# Patient Record
Sex: Female | Born: 1955 | Race: Black or African American | Hispanic: No | Marital: Married | Smoking: Never smoker
Health system: Southern US, Community
[De-identification: ages and names within clinical notes are randomized; demographics above are authoritative.]

---

## 2007-07-14 ENCOUNTER — Ambulatory Visit: Payer: Self-pay | Admitting: Gastroenterology

## 2007-07-31 ENCOUNTER — Ambulatory Visit: Payer: Self-pay | Admitting: Family Medicine

## 2007-08-06 ENCOUNTER — Ambulatory Visit: Payer: Self-pay | Admitting: Family Medicine

## 2007-08-07 ENCOUNTER — Ambulatory Visit: Payer: Self-pay | Admitting: Physician Assistant

## 2008-09-28 ENCOUNTER — Ambulatory Visit: Payer: Self-pay | Admitting: Obstetrics and Gynecology

## 2009-07-24 ENCOUNTER — Emergency Department: Payer: Self-pay | Admitting: Internal Medicine

## 2009-12-14 ENCOUNTER — Ambulatory Visit: Payer: Self-pay | Admitting: Obstetrics and Gynecology

## 2011-10-09 ENCOUNTER — Ambulatory Visit: Payer: Self-pay | Admitting: Obstetrics and Gynecology

## 2013-03-17 ENCOUNTER — Ambulatory Visit: Payer: Self-pay | Admitting: Family Medicine

## 2013-09-29 DIAGNOSIS — E119 Type 2 diabetes mellitus without complications: Secondary | ICD-10-CM | POA: Insufficient documentation

## 2013-09-29 DIAGNOSIS — E785 Hyperlipidemia, unspecified: Secondary | ICD-10-CM | POA: Insufficient documentation

## 2013-09-29 DIAGNOSIS — E559 Vitamin D deficiency, unspecified: Secondary | ICD-10-CM | POA: Insufficient documentation

## 2014-05-13 ENCOUNTER — Ambulatory Visit: Payer: Self-pay | Admitting: Family Medicine

## 2015-10-25 ENCOUNTER — Other Ambulatory Visit: Payer: Self-pay | Admitting: Family Medicine

## 2015-10-25 DIAGNOSIS — Z1231 Encounter for screening mammogram for malignant neoplasm of breast: Secondary | ICD-10-CM

## 2015-11-10 ENCOUNTER — Other Ambulatory Visit: Payer: Self-pay | Admitting: Family Medicine

## 2015-11-10 ENCOUNTER — Ambulatory Visit
Admission: RE | Admit: 2015-11-10 | Discharge: 2015-11-10 | Disposition: A | Discharge status: Home / Self Care | Payer: 59 | Source: Ambulatory Visit | Attending: Family Medicine | Admitting: Family Medicine

## 2015-11-10 DIAGNOSIS — Z1231 Encounter for screening mammogram for malignant neoplasm of breast: Secondary | ICD-10-CM

## 2016-10-02 ENCOUNTER — Other Ambulatory Visit: Payer: Self-pay | Admitting: Family Medicine

## 2016-10-02 DIAGNOSIS — Z1231 Encounter for screening mammogram for malignant neoplasm of breast: Secondary | ICD-10-CM

## 2016-11-13 ENCOUNTER — Ambulatory Visit
Admission: RE | Admit: 2016-11-13 | Discharge: 2016-11-13 | Disposition: A | Discharge status: Home / Self Care | Payer: 59 | Source: Ambulatory Visit | Attending: Family Medicine | Admitting: Family Medicine

## 2016-11-13 DIAGNOSIS — Z1231 Encounter for screening mammogram for malignant neoplasm of breast: Secondary | ICD-10-CM | POA: Insufficient documentation

## 2017-03-26 HISTORY — PX: BREAST CYST ASPIRATION: SHX578

## 2017-05-14 ENCOUNTER — Other Ambulatory Visit: Payer: Self-pay | Admitting: Obstetrics and Gynecology

## 2017-05-14 DIAGNOSIS — Z1231 Encounter for screening mammogram for malignant neoplasm of breast: Secondary | ICD-10-CM

## 2017-11-14 ENCOUNTER — Ambulatory Visit
Admission: RE | Admit: 2017-11-14 | Discharge: 2017-11-14 | Disposition: A | Discharge status: Home / Self Care | Payer: BLUE CROSS/BLUE SHIELD | Source: Ambulatory Visit | Attending: Obstetrics and Gynecology | Admitting: Obstetrics and Gynecology

## 2017-11-14 DIAGNOSIS — Z1231 Encounter for screening mammogram for malignant neoplasm of breast: Secondary | ICD-10-CM | POA: Diagnosis not present

## 2017-11-15 ENCOUNTER — Other Ambulatory Visit: Payer: Self-pay | Admitting: Obstetrics and Gynecology

## 2017-11-15 DIAGNOSIS — R928 Other abnormal and inconclusive findings on diagnostic imaging of breast: Secondary | ICD-10-CM

## 2017-11-28 ENCOUNTER — Ambulatory Visit
Admission: RE | Admit: 2017-11-28 | Discharge: 2017-11-28 | Disposition: A | Discharge status: Home / Self Care | Payer: BLUE CROSS/BLUE SHIELD | Source: Ambulatory Visit | Attending: Obstetrics and Gynecology | Admitting: Obstetrics and Gynecology

## 2017-11-28 DIAGNOSIS — R928 Other abnormal and inconclusive findings on diagnostic imaging of breast: Secondary | ICD-10-CM | POA: Diagnosis not present

## 2017-12-02 ENCOUNTER — Other Ambulatory Visit: Payer: Self-pay | Admitting: Obstetrics and Gynecology

## 2017-12-02 DIAGNOSIS — R928 Other abnormal and inconclusive findings on diagnostic imaging of breast: Secondary | ICD-10-CM

## 2017-12-02 DIAGNOSIS — N6310 Unspecified lump in the right breast, unspecified quadrant: Secondary | ICD-10-CM

## 2017-12-12 ENCOUNTER — Ambulatory Visit
Admission: RE | Admit: 2017-12-12 | Discharge: 2017-12-12 | Disposition: A | Discharge status: Home / Self Care | Payer: BLUE CROSS/BLUE SHIELD | Source: Ambulatory Visit | Attending: Obstetrics and Gynecology | Admitting: Obstetrics and Gynecology

## 2017-12-12 ENCOUNTER — Other Ambulatory Visit: Payer: Self-pay | Admitting: Obstetrics and Gynecology

## 2017-12-12 DIAGNOSIS — N6310 Unspecified lump in the right breast, unspecified quadrant: Secondary | ICD-10-CM | POA: Diagnosis present

## 2017-12-12 DIAGNOSIS — R928 Other abnormal and inconclusive findings on diagnostic imaging of breast: Secondary | ICD-10-CM

## 2018-12-09 ENCOUNTER — Encounter: Payer: Self-pay | Admitting: Podiatry

## 2018-12-09 ENCOUNTER — Ambulatory Visit: Payer: Managed Care, Other (non HMO) | Admitting: Podiatry

## 2018-12-09 ENCOUNTER — Other Ambulatory Visit: Payer: Self-pay

## 2018-12-09 VITALS — BP 126/78 | HR 79

## 2018-12-09 DIAGNOSIS — L600 Ingrowing nail: Secondary | ICD-10-CM | POA: Diagnosis not present

## 2018-12-09 MED ORDER — GENTAMICIN SULFATE 0.1 % EX CREA: 1.0000 "application " | TOPICAL_CREAM | Freq: Two times a day (BID) | CUTANEOUS | 1 refills | Status: AC

## 2018-12-09 NOTE — Patient Instructions (Signed)

## 2018-12-11 ENCOUNTER — Telehealth: Payer: Self-pay | Admitting: *Deleted

## 2018-12-11 NOTE — Telephone Encounter (Signed)
Pt called again stating she is having sharp pain in her toes and would like to know if the doctor can prescribe her something to help with her pain. Please give patient a call.

## 2018-12-11 NOTE — Telephone Encounter (Signed)
Pt states 12/09/2018 Dr. Amalia Hailey performed ingrown toenail procedures and she is having a lot of burning and sharp pain, she was out of work yesterday, but is at work today, the area is red and puffy.

## 2018-12-11 NOTE — Telephone Encounter (Signed)
I spoke with patient, she stated that her toe was burning and very painful during the night time and Tylenol is not helping.  I informed her that she is more than likely having a reaction to the Phenol and to continue to soak, apply antibiotic ointment as directed and take Ibuprofen 400mg  with one Tylenol ES to see if it gives relief.  I informed her of s/s of infection and if no better by Monday to call office back to be seen.  She verbalized understanding

## 2018-12-12 NOTE — Progress Notes (Signed)
   Subjective: Patient presents today for evaluation of pain to the medial borders of the bilateral great toes that began a few weeks ago. She reports associated redness. Patient is concerned for possible ingrown nail. Wearing shoes and applying pressure to the toes increases the pain. She has not had any treatment for the symptoms. Patient presents today for further treatment and evaluation.  History reviewed. No pertinent past medical history.  Objective:  General: Well developed, nourished, in no acute distress, alert and oriented x3   Dermatology: Skin is warm, dry and supple bilateral. Medial borders of the bilateral great toes appears to be erythematous with evidence of an ingrowing nail. Pain on palpation noted to the border of the nail fold. The remaining nails appear unremarkable at this time. There are no open sores, lesions.  Vascular: Dorsalis Pedis artery and Posterior Tibial artery pedal pulses palpable. No lower extremity edema noted.   Neruologic: Grossly intact via light touch bilateral.  Musculoskeletal: Muscular strength within normal limits in all groups bilateral. Normal range of motion noted to all pedal and ankle joints.   Assesement: #1 Paronychia with ingrowing nail medial borders bilateral great toes #2 Pain in toe #3 Incurvated nail  Plan of Care:  1. Patient evaluated.  2. Discussed treatment alternatives and plan of care. Explained nail avulsion procedure and post procedure course to patient. 3. Patient opted for permanent partial nail avulsion of the medial borders bilateral great toes.  4. Prior to procedure, local anesthesia infiltration utilized using 3 ml of a 50:50 mixture of 2% plain lidocaine and 0.5% plain marcaine in a normal hallux block fashion and a betadine prep performed.  5. Partial permanent nail avulsion with chemical matrixectomy performed using [id number] applications of phenol followed by alcohol flush.  6. Light dressing applied. 7.  Prescription for Gentamicin cream provided to patient to use daily with a bandage.  8. Post op shoes dispensed bilaterally.  9. Return to clinic in 2 weeks.  Works at [employer]. Daughter is getting married in 75 weeks.   Edrick Kins, DPM [facility]  Dr. Edrick Kins, [address]                                        [place], [place] [address]                Office [phone]  Fax [phone]

## 2018-12-26 ENCOUNTER — Ambulatory Visit: Payer: Managed Care, Other (non HMO) | Admitting: Podiatry

## 2018-12-26 ENCOUNTER — Other Ambulatory Visit: Payer: Self-pay

## 2018-12-26 ENCOUNTER — Encounter: Payer: Self-pay | Admitting: Podiatry

## 2018-12-26 DIAGNOSIS — L600 Ingrowing nail: Secondary | ICD-10-CM

## 2018-12-30 NOTE — Progress Notes (Signed)
   Subjective: 63 y.o. female presents today status post permanent nail avulsion procedure of the medial borders of the bilateral great toes that was performed on 12/09/2018. She states the toes have improved but she still experiences some intermittent soreness and drainage from the right one. She has been using the Gentamicin cream and the post op shoes as directed. Patient is here for further evaluation and treatment.    No past medical history on file.  Objective: Skin is warm, dry and supple. Nail and respective nail fold appears to be healing appropriately. Open wound to the associated nail fold with a granular wound base and moderate amount of fibrotic tissue. Minimal drainage noted. Mild erythema around the periungual region likely due to phenol chemical matricectomy.  Assessment: #1 postop permanent partial nail avulsion medial borders bilateral great toes #2 open wound periungual nail fold of respective digit.   Plan of care: #1 patient was evaluated  #2 debridement of open wound was performed to the periungual border of the respective toe using a currette. Antibiotic ointment and Band-Aid was applied. #3 patient is to return to clinic on a PRN basis.   Edrick Kins, DPM [facility]  Dr. Edrick Kins, DPM    [address]                                        [place], [place] [address]                Office [phone]  Fax [phone]

## 2019-03-12 ENCOUNTER — Other Ambulatory Visit: Payer: Self-pay

## 2019-03-12 DIAGNOSIS — Z20822 Contact with and (suspected) exposure to covid-19: Secondary | ICD-10-CM

## 2019-03-13 LAB — NOVEL CORONAVIRUS, NAA: SARS-CoV-2, NAA: NOT DETECTED

## 2019-06-09 ENCOUNTER — Other Ambulatory Visit: Payer: Self-pay

## 2019-06-09 ENCOUNTER — Ambulatory Visit: Payer: Managed Care, Other (non HMO) | Admitting: Podiatry

## 2019-06-09 ENCOUNTER — Ambulatory Visit (INDEPENDENT_AMBULATORY_CARE_PROVIDER_SITE_OTHER): Payer: Managed Care, Other (non HMO)

## 2019-06-09 ENCOUNTER — Encounter: Payer: Self-pay | Admitting: Podiatry

## 2019-06-09 ENCOUNTER — Other Ambulatory Visit: Payer: Self-pay | Admitting: Podiatry

## 2019-06-09 VITALS — Temp 97.3°F

## 2019-06-09 DIAGNOSIS — M722 Plantar fascial fibromatosis: Secondary | ICD-10-CM

## 2019-06-09 DIAGNOSIS — L600 Ingrowing nail: Secondary | ICD-10-CM | POA: Diagnosis not present

## 2019-06-09 MED ORDER — MELOXICAM 15 MG PO TABS: 15.0000 mg | ORAL_TABLET | Freq: Every day | ORAL | 1 refills | Status: DC

## 2019-06-09 NOTE — Progress Notes (Signed)
   Subjective: 64 y.o. female presenting today with a chief complaint of sharp pain of the right heel that began 2-3 days ago. She states the pain is worse in the morning. Being on the foot makes the pain subside. Stretching increases the pain.  She also reports intermittent pain to the medial border of the right great toe that has been ongoing for the past several months. Touching the toe increases the pain. She has not done anything for treatment. She reports h/o ingrown nails to bilateral great toes. Patient is here for further evaluation and treatment.   No past medical history on file.   Objective: Physical Exam General: The patient is alert and oriented x3 in no acute distress.  Dermatology: Skin is warm, dry and supple bilateral lower extremities. Negative for open lesions or macerations bilateral.  Medial border of the right great toe appears to be erythematous with evidence of an ingrowing nail. Pain on palpation noted to the border of the nail fold. The remaining nails appear unremarkable at this time.   Vascular: Dorsalis Pedis and Posterior Tibial pulses palpable bilateral.  Capillary fill time is immediate to all digits.  Neurological: Epicritic and protective threshold intact bilateral.   Musculoskeletal: Tenderness to palpation to the plantar aspect of the right heel along the plantar fascia. All other joints range of motion within normal limits bilateral. Strength 5/5 in all groups bilateral.   Radiographic exam: Normal osseous mineralization. Joint spaces preserved. No fracture/dislocation/boney destruction. No other soft tissue abnormalities or radiopaque foreign bodies.   Assessment: 1. Plantar fasciitis right 2. 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 H/o ingrown procedures medial and lateral borders bilateral great toes   Plan of Care:  1. Patient evaluated. Xrays reviewed.   2. Declined injections. Patient is diabetic and they make CBGs elevated.     3. Prescription for Meloxicam provided to patient. 4. Plantar fascial brace dispensed.  5. Night splint dispensed.  6. Mechanical debridement of the right great toenail performed using a nail nipper. Filed with dremel without incident. If not improved, we will proceed with partial permanent nail avulsion procedure.  7. Return to clinic in 2 months.     Felecia Shelling, DPM [facility]  Dr. Felecia Shelling, DPM    [address] [place], [place] [address]                Office [phone]  Fax [phone]

## 2019-06-26 IMAGING — US US BREAST*R* LIMITED INC AXILLA
1 series · 7 of 7 positions shown · non-contrast
Comparison: Previous exam(s).

CLINICAL DATA: Right breast upper outer quadrant nodule seen on
most recent screening mammography.

EXAM:
DIGITAL DIAGNOSTIC RIGHT MAMMOGRAM WITH CAD AND TOMO
ULTRASOUND RIGHT BREAST

[Series 1: us breast*right* limited inc axilla · 0.06mm/px · 7 of 7 slices shown]
[im 1/7]
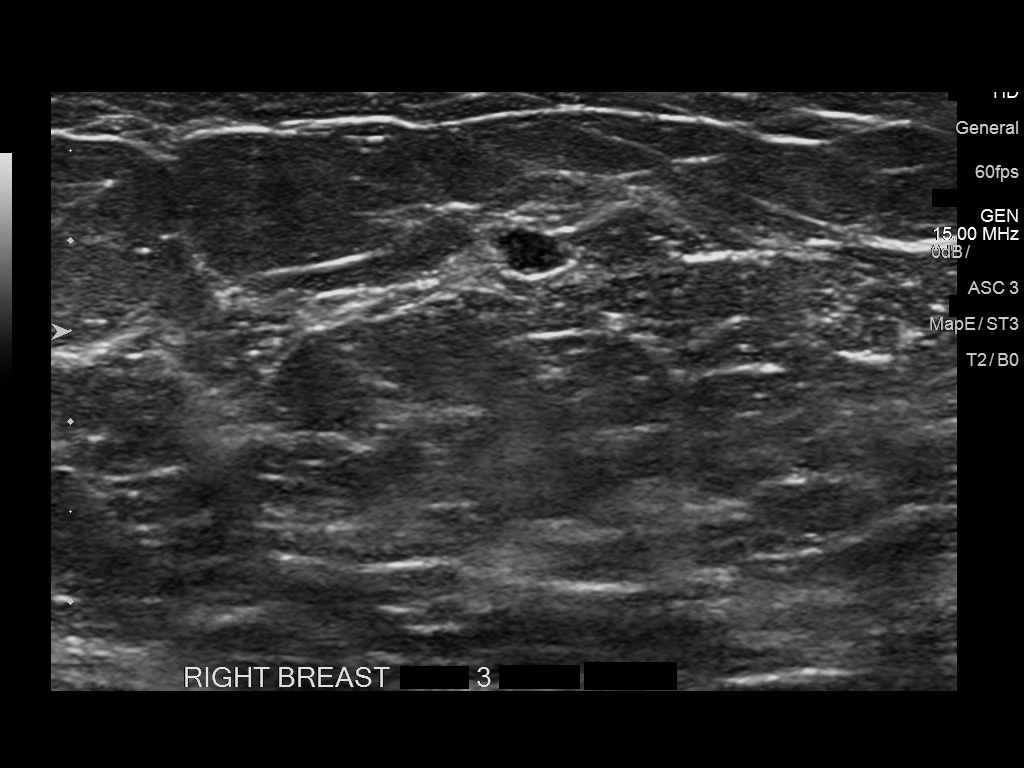
[im 2/7]
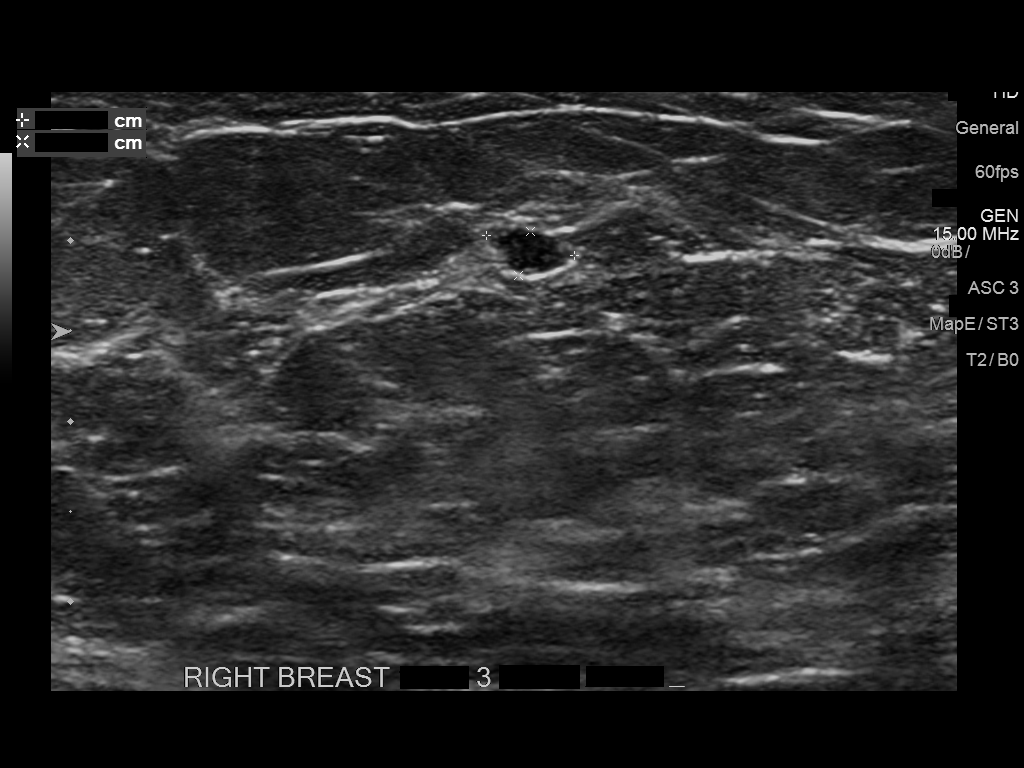
[im 3/7]
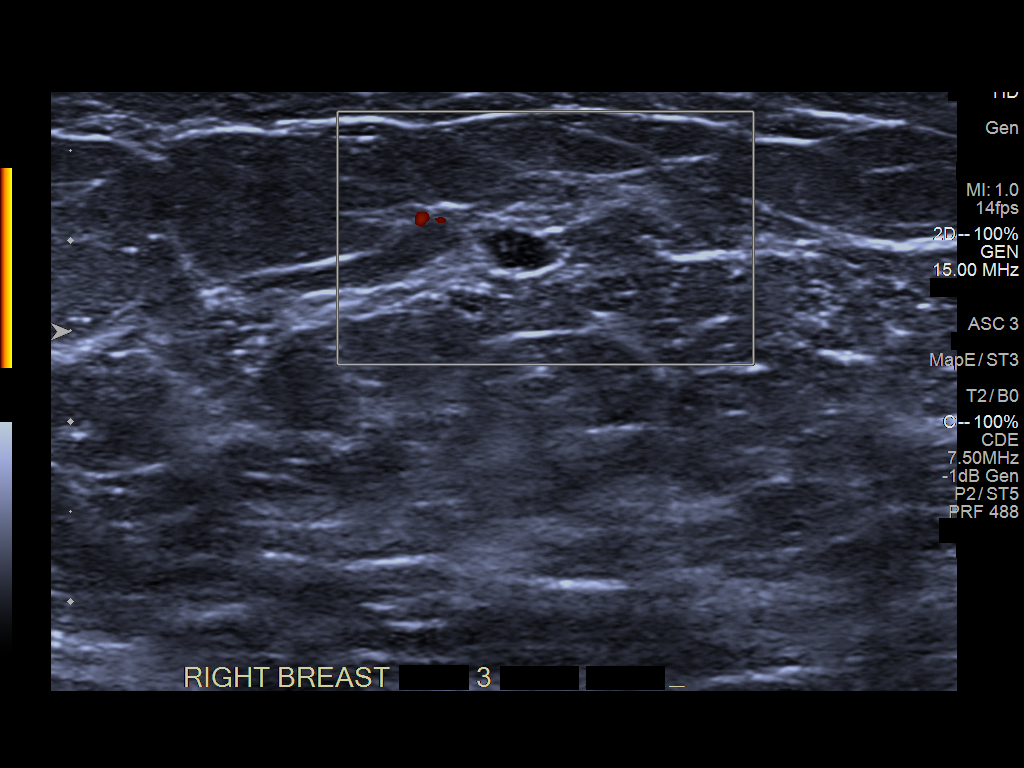
[im 4/7]
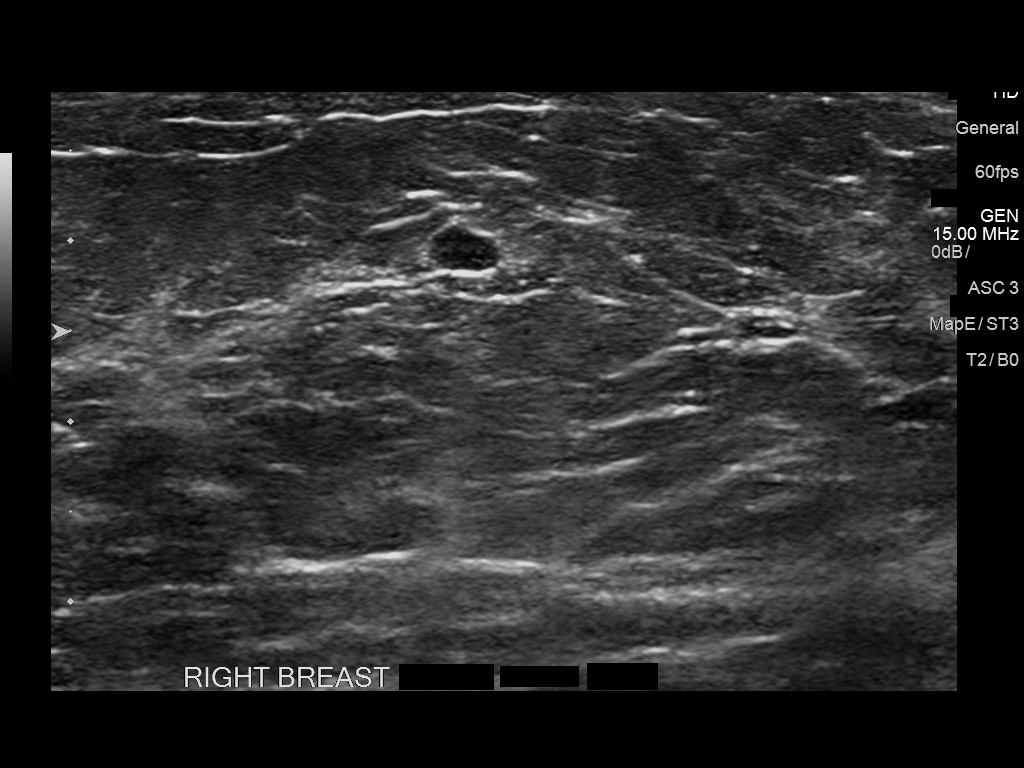
[im 5/7]
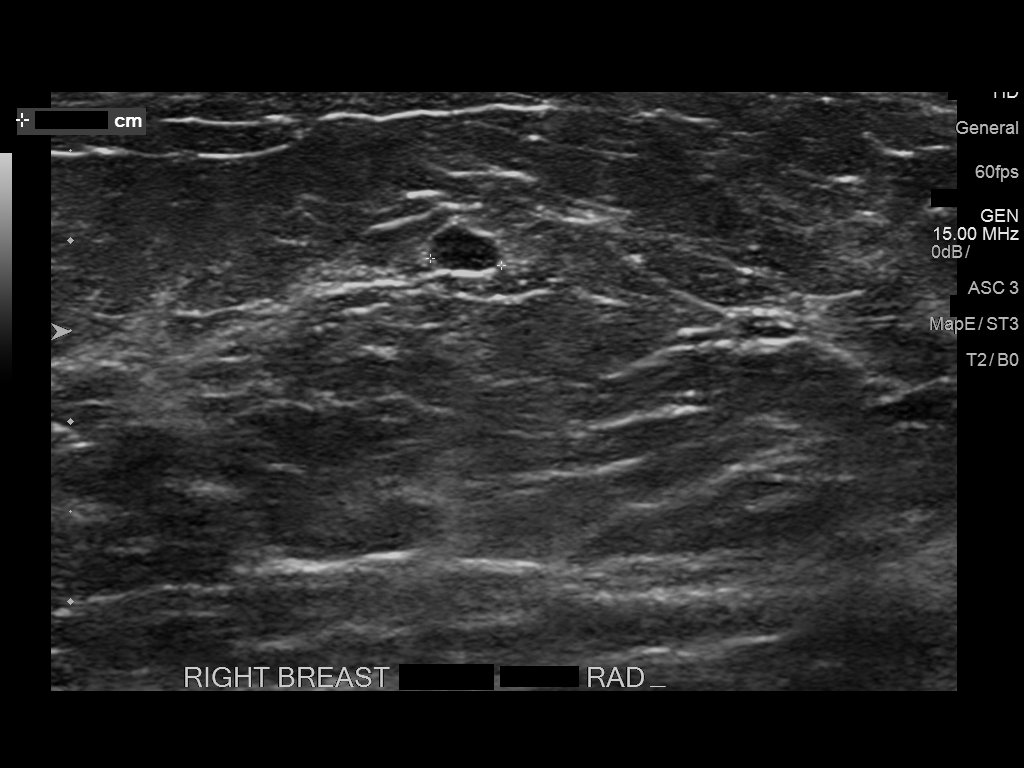
[im 6/7]
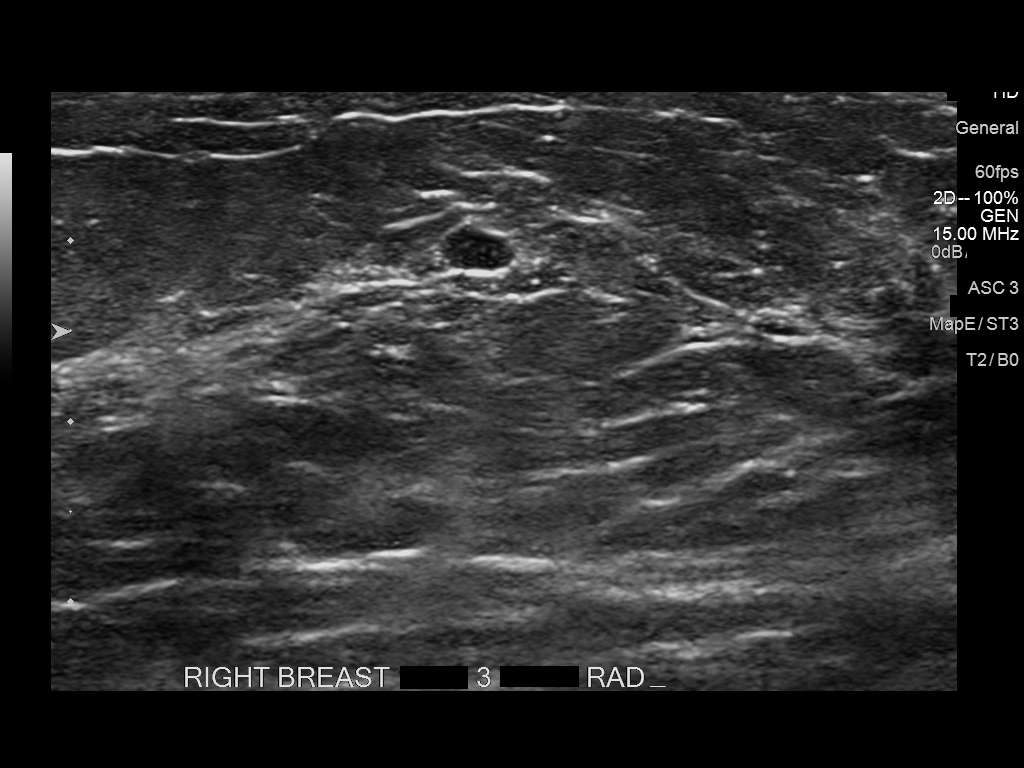
[im 7/7]
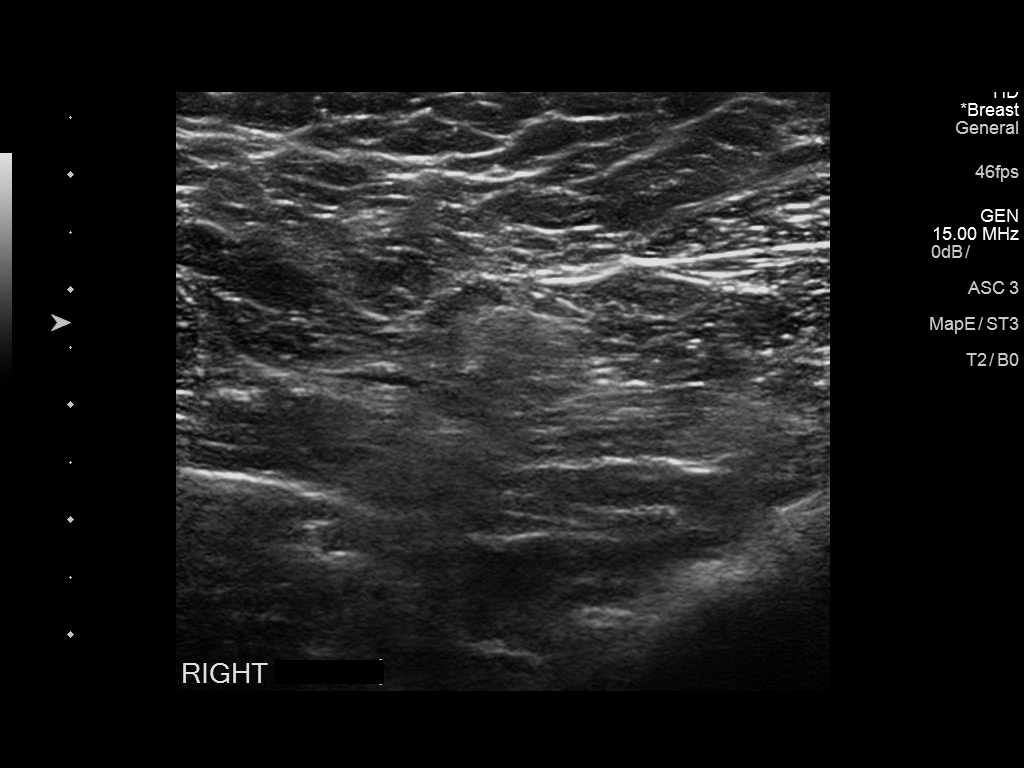

[7 of 7 positions shown; findings below may reference images not displayed]

ACR Breast Density Category b: There are scattered areas of
fibroglandular density.
FINDINGS: Additional mammographic views of the right breast demonstrate
persistent circumscribed few mm nodule in the right breast upper
outer quadrant, middle depth.

Mammographic images were processed with CAD.

On physical exam, no suspicious masses are palpated.

Targeted ultrasound is performed, showing right breast 10 o'clock 3
cm from the nipple hypoechoic circumscribed nodule measuring 0.4 x
0.5 x 0.3 cm. This finding corresponds to the mammographically seen
nodule. No evidence of right axillary lymphadenopathy.
IMPRESSION: Right breast 10 o'clock 5 mm nodule, for which ultrasound-guided
core needle biopsy is recommended.

RECOMMENDATION:
Ultrasound-guided core needle biopsy of the right breast.

I have discussed the findings and recommendations with the patient.
Results were also provided in writing at the conclusion of the
visit. If applicable, a reminder letter will be sent to the patient
regarding the next appointment.

BI-RADS CATEGORY  4: Suspicious.

## 2019-07-06 ENCOUNTER — Other Ambulatory Visit: Payer: Self-pay | Admitting: Podiatry

## 2019-07-21 ENCOUNTER — Ambulatory Visit: Payer: Managed Care, Other (non HMO) | Admitting: Podiatry

## 2019-07-21 ENCOUNTER — Other Ambulatory Visit: Payer: Self-pay

## 2019-07-21 DIAGNOSIS — L600 Ingrowing nail: Secondary | ICD-10-CM

## 2019-07-21 DIAGNOSIS — M722 Plantar fascial fibromatosis: Secondary | ICD-10-CM

## 2019-07-23 NOTE — Progress Notes (Signed)
   Subjective: 64 y.o. female presenting today for follow up evaluation of burning pain to the right great toe and continued plantar fasciitis of the right foot. She is concerned that the right great toenail is going to become detached and come off. She reports intermittent sharp pain of the right foot stating it is worse in the morning when she first gets out of bed. She states the symptoms improve throughout the day as she walks more. She has been taking Meloxicam for treatment. Patient is here for further evaluation and treatment.   No past medical history on file.   Objective: Physical Exam General: The patient is alert and oriented x3 in no acute distress.  Dermatology: Skin is warm, dry and supple bilateral lower extremities. Negative for open lesions or macerations bilateral.  Medial border of the right great toe appears to be erythematous with evidence of an ingrowing nail. Pain on palpation noted to the border of the nail fold. The remaining nails appear unremarkable at this time.   Vascular: Dorsalis Pedis and Posterior Tibial pulses palpable bilateral.  Capillary fill time is immediate to all digits.  Neurological: Epicritic and protective threshold intact bilateral.   Musculoskeletal: Tenderness to palpation to the plantar aspect of the right heel along the plantar fascia. All other joints range of motion within normal limits bilateral. Strength 5/5 in all groups bilateral.   Assessment: 1. Plantar fasciitis right 2. 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 H/o ingrown procedures medial and lateral borders bilateral great toes   Plan of Care:  1. Patient evaluated. 2. Continue taking Meloxicam.  3. Recommended good shoe gear.  4. Continue good foot hygiene.  5. Continue conservative care of toenail at this time.  6. Return to clinic as needed.   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     Felecia Shelling, DPM [facility]  Dr. Felecia Shelling, DPM    [address] [place], [place] [address]                Office [phone]  Fax [phone]

## 2019-11-10 ENCOUNTER — Other Ambulatory Visit: Payer: Self-pay | Admitting: Family Medicine

## 2019-11-10 DIAGNOSIS — Z1231 Encounter for screening mammogram for malignant neoplasm of breast: Secondary | ICD-10-CM

## 2019-12-22 ENCOUNTER — Other Ambulatory Visit: Payer: Self-pay

## 2019-12-22 ENCOUNTER — Ambulatory Visit
Admission: RE | Admit: 2019-12-22 | Discharge: 2019-12-22 | Disposition: A | Discharge status: Home / Self Care | Payer: Managed Care, Other (non HMO) | Source: Ambulatory Visit | Attending: Family Medicine | Admitting: Family Medicine

## 2019-12-22 DIAGNOSIS — Z1231 Encounter for screening mammogram for malignant neoplasm of breast: Secondary | ICD-10-CM | POA: Diagnosis present

## 2019-12-28 IMAGING — MG MM DIGITAL SCREENING BILAT W/ TOMO W/ CAD
8 series · 8 of 24 positions shown · non-contrast
Comparison: Previous exam(s).

CLINICAL DATA: Screening.

EXAM:
DIGITAL SCREENING BILATERAL MAMMOGRAM WITH TOMO AND CAD

[L MLO synth-2D]
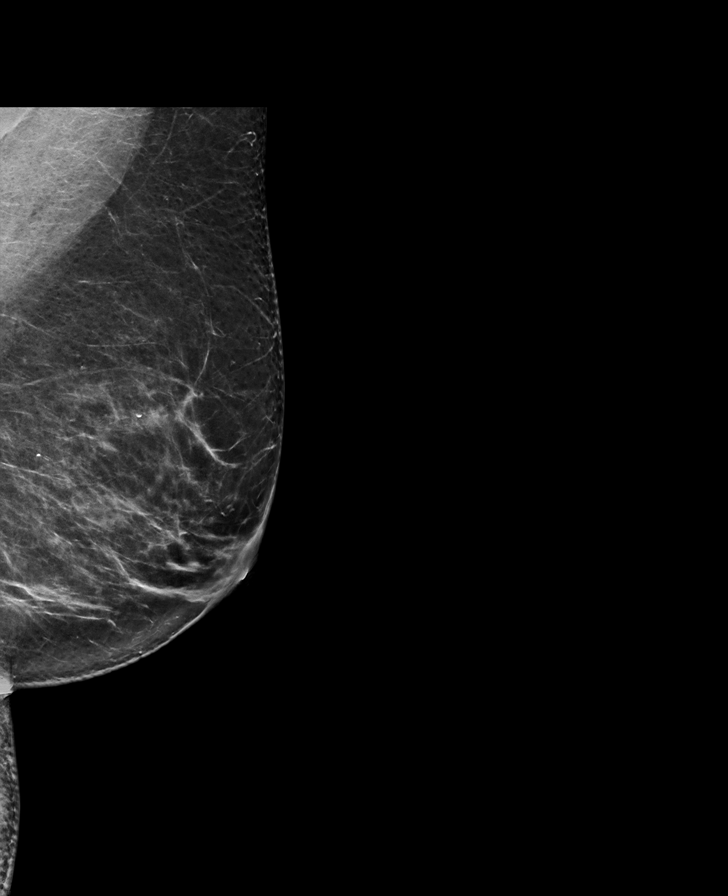

[L CC synth-2D]
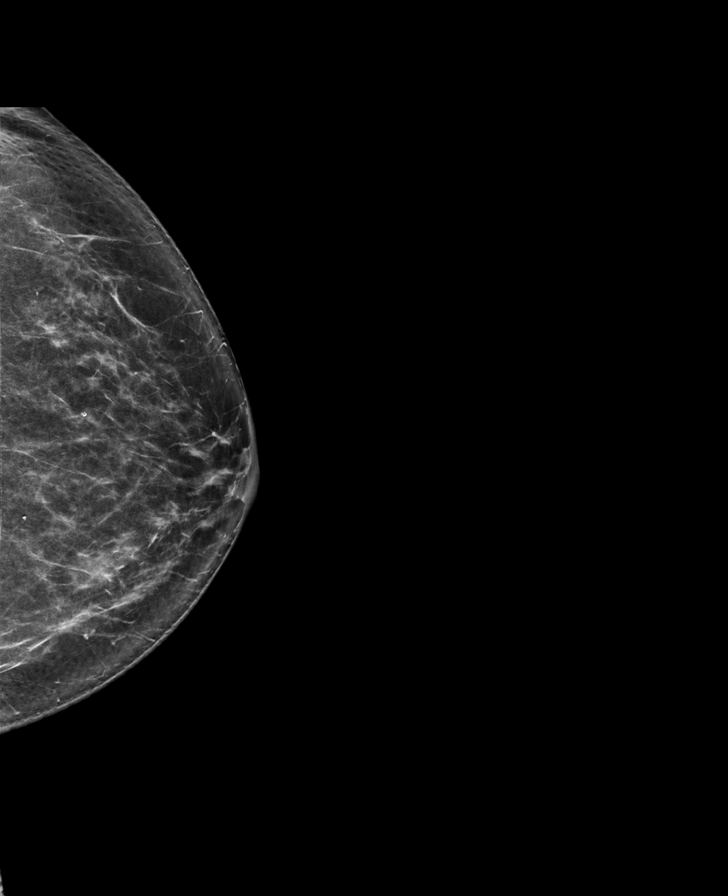

[R CC synth-2D]
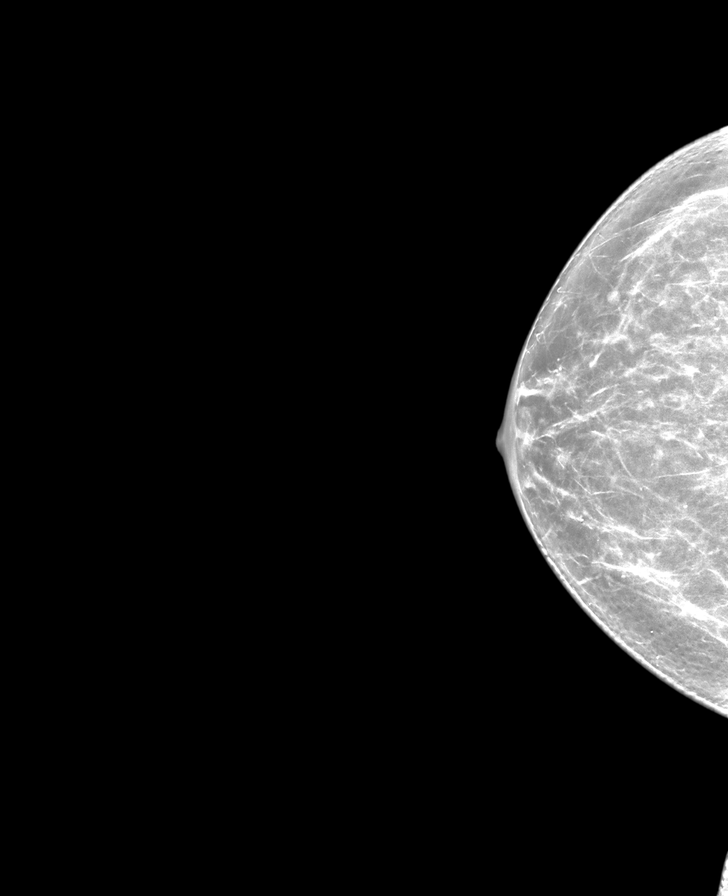

[R MLO synth-2D]
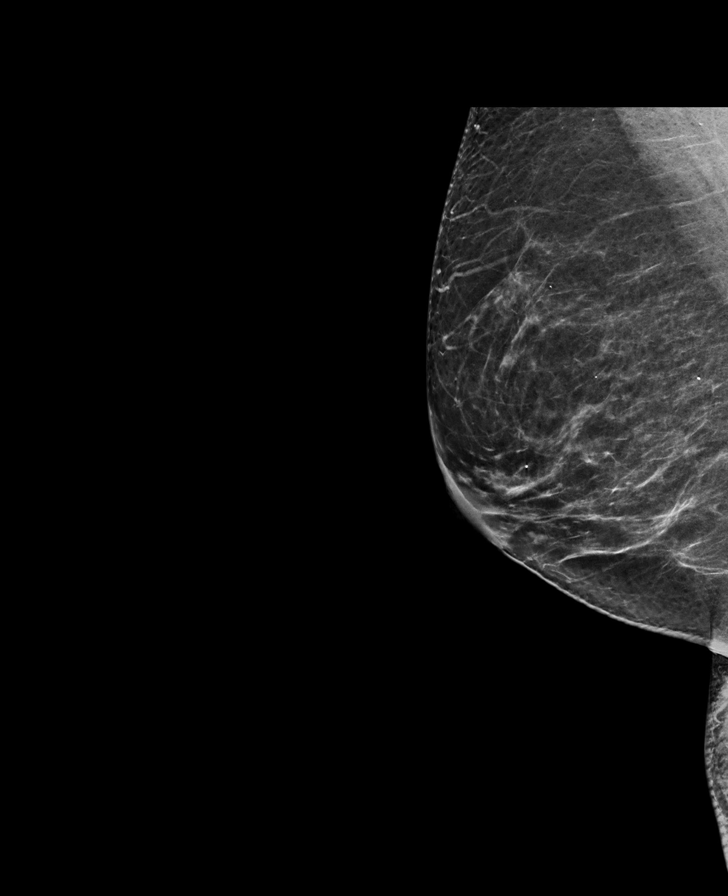

[L CC tomo · tomo slice 39/78.0]
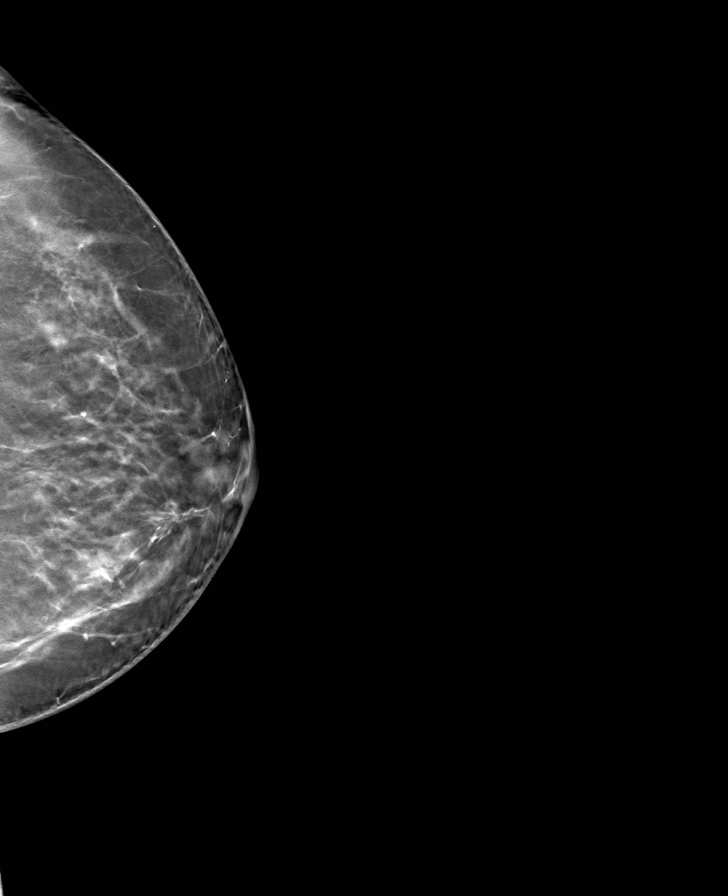

[R CC tomo · tomo slice 35/70.0]
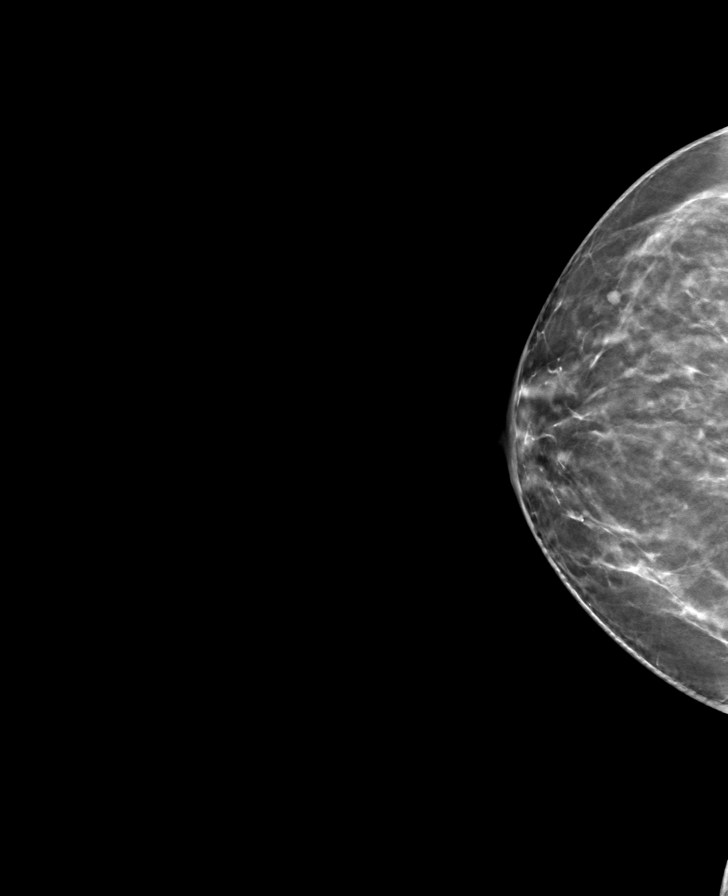

[L MLO tomo · tomo slice 41/80.0]
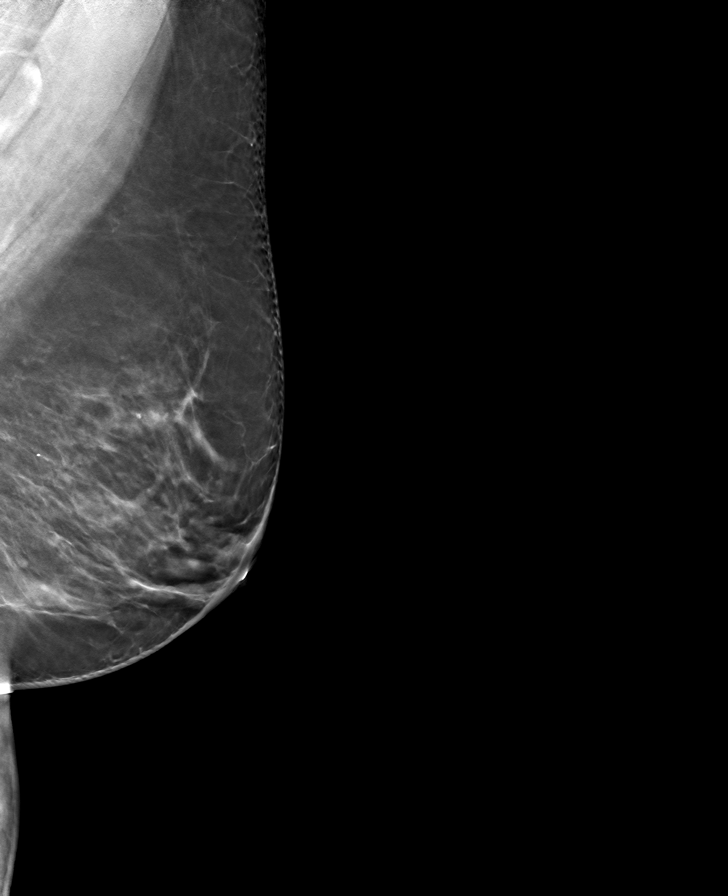

[R MLO tomo · tomo slice 38/75.0]
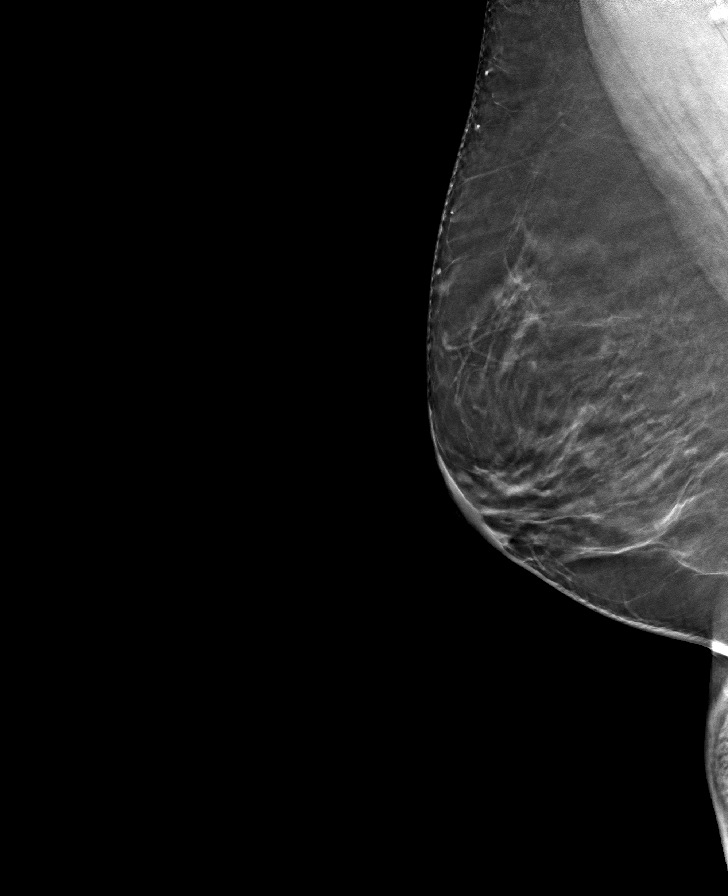

[8 of 24 positions shown; findings below may reference images not displayed]

ACR Breast Density Category b: There are scattered areas of
fibroglandular density.
FINDINGS: In the right breast, a possible mass warrants further evaluation. In
the left breast, no findings suspicious for malignancy. Images were
processed with CAD.
IMPRESSION: Further evaluation is suggested for possible mass in the right
breast.

RECOMMENDATION:
Diagnostic mammogram and possibly ultrasound of the right breast.
(Code:T1-A-550)

The patient will be contacted regarding the findings, and additional
imaging will be scheduled.

BI-RADS CATEGORY  0: Incomplete. Need additional imaging evaluation
and/or prior mammograms for comparison.

## 2020-04-02 ENCOUNTER — Other Ambulatory Visit: Payer: Self-pay | Admitting: Podiatry

## 2020-04-03 NOTE — Telephone Encounter (Signed)
Please advise 

## 2020-11-17 ENCOUNTER — Other Ambulatory Visit: Payer: Self-pay | Admitting: Family Medicine

## 2020-11-17 DIAGNOSIS — Z1231 Encounter for screening mammogram for malignant neoplasm of breast: Secondary | ICD-10-CM

## 2020-12-22 ENCOUNTER — Ambulatory Visit
Admission: RE | Admit: 2020-12-22 | Discharge: 2020-12-22 | Disposition: A | Discharge status: Home / Self Care | Payer: Medicare Other | Source: Ambulatory Visit | Attending: Family Medicine | Admitting: Family Medicine

## 2020-12-22 ENCOUNTER — Other Ambulatory Visit: Payer: Self-pay

## 2020-12-22 DIAGNOSIS — Z1231 Encounter for screening mammogram for malignant neoplasm of breast: Secondary | ICD-10-CM | POA: Diagnosis not present

## 2021-11-16 ENCOUNTER — Other Ambulatory Visit: Payer: Self-pay | Admitting: Family Medicine

## 2021-11-16 DIAGNOSIS — Z1231 Encounter for screening mammogram for malignant neoplasm of breast: Secondary | ICD-10-CM

## 2021-12-25 ENCOUNTER — Ambulatory Visit
Admission: RE | Admit: 2021-12-25 | Discharge: 2021-12-25 | Disposition: A | Discharge status: Home / Self Care | Payer: Medicare Other | Source: Ambulatory Visit | Attending: Family Medicine | Admitting: Family Medicine

## 2021-12-25 DIAGNOSIS — Z1231 Encounter for screening mammogram for malignant neoplasm of breast: Secondary | ICD-10-CM | POA: Diagnosis present

## 2022-05-16 ENCOUNTER — Ambulatory Visit: Payer: Medicare Other | Admitting: Podiatry

## 2022-05-16 VITALS — BP 122/73 | HR 79

## 2022-05-16 DIAGNOSIS — L989 Disorder of the skin and subcutaneous tissue, unspecified: Secondary | ICD-10-CM | POA: Diagnosis not present

## 2022-05-18 NOTE — Progress Notes (Signed)
   Chief Complaint  Patient presents with   Callouses    Left foot 5th toe corn started hurting 6 months ago,     Subjective: 67 y.o. female presenting to the office today for new complaint of pain and tenderness associated to the left fifth digit.  Patient states that she has a corn to this area that is very symptomatic and tender.  She presents for further treatment and evaluation   No past medical history on file.  Past Surgical History:  Procedure Laterality Date   BREAST CYST ASPIRATION Right 2019   resolved    No Known Allergies   Objective:  Physical Exam General: Alert and oriented x3 in no acute distress  Dermatology: Hyperkeratotic lesion(s) present on the lateral aspect of the left fifth digit. Pain on palpation with a central nucleated core noted. Skin is warm, dry and supple bilateral lower extremities. Negative for open lesions or macerations.  Vascular: Palpable pedal pulses bilaterally. No edema or erythema noted. Capillary refill within normal limits.  Neurological: Epicritic and protective threshold grossly intact bilaterally.   Musculoskeletal Exam: Pain on palpation at the keratotic lesion(s) noted. Range of motion within normal limits bilateral. Muscle strength 5/5 in all groups bilateral.  Assessment: 1.  Symptomatic callus; benign skin lesion left fifth digit   Plan of Care:  1. Patient evaluated 2. Excisional debridement of keratoic lesion(s) using a chisel blade was performed without incident.  3. Dressed area with light dressing.  Patient felt significant relief after putting her shoes on. 4. Patient is to return to the clinic PRN.   Edrick Kins, DPM [facility]  Dr. Edrick Kins, DPM    [address], [place] [address]                Office [phone]  Fax [phone]

## 2023-01-21 ENCOUNTER — Other Ambulatory Visit: Payer: Self-pay | Admitting: Family Medicine

## 2023-01-21 DIAGNOSIS — Z1231 Encounter for screening mammogram for malignant neoplasm of breast: Secondary | ICD-10-CM

## 2023-01-28 ENCOUNTER — Ambulatory Visit
Admission: RE | Admit: 2023-01-28 | Discharge: 2023-01-28 | Disposition: A | Discharge status: Home / Self Care | Payer: Medicare Other | Source: Ambulatory Visit | Attending: Family Medicine | Admitting: Family Medicine

## 2023-01-28 DIAGNOSIS — Z1231 Encounter for screening mammogram for malignant neoplasm of breast: Secondary | ICD-10-CM | POA: Insufficient documentation

## 2023-12-24 ENCOUNTER — Other Ambulatory Visit: Payer: Self-pay | Admitting: Family Medicine

## 2023-12-24 DIAGNOSIS — Z1231 Encounter for screening mammogram for malignant neoplasm of breast: Secondary | ICD-10-CM

## 2024-01-29 ENCOUNTER — Ambulatory Visit
Admission: RE | Admit: 2024-01-29 | Discharge: 2024-01-29 | Disposition: A | Discharge status: Home / Self Care | Source: Ambulatory Visit | Attending: Family Medicine | Admitting: Family Medicine

## 2024-01-29 DIAGNOSIS — Z1231 Encounter for screening mammogram for malignant neoplasm of breast: Secondary | ICD-10-CM | POA: Diagnosis present
